# Patient Record
Sex: Female | Born: 1975 | Race: White | Hispanic: No | Marital: Married | Smoking: Never smoker
Health system: Southern US, Community
[De-identification: ages and names within clinical notes are randomized; demographics above are authoritative.]

## PROBLEM LIST (undated history)

## (undated) DIAGNOSIS — R519 Headache, unspecified: Secondary | ICD-10-CM

## (undated) DIAGNOSIS — R51 Headache: Secondary | ICD-10-CM

## (undated) DIAGNOSIS — G709 Myoneural disorder, unspecified: Secondary | ICD-10-CM

## (undated) HISTORY — PX: FOOT SURGERY: SHX648

## (undated) HISTORY — PX: WISDOM TOOTH EXTRACTION: SHX21

---

## 2017-05-28 ENCOUNTER — Other Ambulatory Visit: Payer: Self-pay | Admitting: Specialist

## 2017-05-28 ENCOUNTER — Other Ambulatory Visit: Payer: Self-pay

## 2017-05-28 DIAGNOSIS — Z1231 Encounter for screening mammogram for malignant neoplasm of breast: Secondary | ICD-10-CM

## 2017-06-08 ENCOUNTER — Encounter (INDEPENDENT_AMBULATORY_CARE_PROVIDER_SITE_OTHER): Payer: Self-pay

## 2017-06-08 ENCOUNTER — Ambulatory Visit
Admission: RE | Admit: 2017-06-08 | Discharge: 2017-06-08 | Disposition: A | Discharge status: Home / Self Care | Payer: BLUE CROSS/BLUE SHIELD | Source: Ambulatory Visit

## 2017-06-08 DIAGNOSIS — Z1231 Encounter for screening mammogram for malignant neoplasm of breast: Secondary | ICD-10-CM

## 2017-07-02 ENCOUNTER — Other Ambulatory Visit: Payer: Self-pay | Admitting: Specialist

## 2017-07-02 DIAGNOSIS — R928 Other abnormal and inconclusive findings on diagnostic imaging of breast: Secondary | ICD-10-CM

## 2017-07-05 ENCOUNTER — Ambulatory Visit
Admission: RE | Admit: 2017-07-05 | Discharge: 2017-07-05 | Disposition: A | Discharge status: Home / Self Care | Payer: BLUE CROSS/BLUE SHIELD | Source: Ambulatory Visit | Attending: Specialist | Admitting: Specialist

## 2017-07-05 ENCOUNTER — Ambulatory Visit: Payer: BLUE CROSS/BLUE SHIELD

## 2017-07-05 DIAGNOSIS — R928 Other abnormal and inconclusive findings on diagnostic imaging of breast: Secondary | ICD-10-CM

## 2018-06-10 ENCOUNTER — Other Ambulatory Visit: Payer: Self-pay | Admitting: Specialist

## 2018-06-10 DIAGNOSIS — Z1231 Encounter for screening mammogram for malignant neoplasm of breast: Secondary | ICD-10-CM

## 2018-07-08 ENCOUNTER — Ambulatory Visit
Admission: RE | Admit: 2018-07-08 | Discharge: 2018-07-08 | Disposition: A | Discharge status: Home / Self Care | Payer: BLUE CROSS/BLUE SHIELD | Source: Ambulatory Visit | Attending: Specialist | Admitting: Specialist

## 2018-07-08 DIAGNOSIS — Z1231 Encounter for screening mammogram for malignant neoplasm of breast: Secondary | ICD-10-CM

## 2018-12-16 ENCOUNTER — Other Ambulatory Visit: Payer: Self-pay | Admitting: Orthopedic Surgery

## 2018-12-23 ENCOUNTER — Encounter
Admission: RE | Admit: 2018-12-23 | Discharge: 2018-12-23 | Disposition: A | Discharge status: Home / Self Care | Payer: BLUE CROSS/BLUE SHIELD | Source: Ambulatory Visit | Attending: Orthopedic Surgery | Admitting: Orthopedic Surgery

## 2018-12-23 ENCOUNTER — Other Ambulatory Visit: Payer: Self-pay

## 2018-12-23 DIAGNOSIS — Z01812 Encounter for preprocedural laboratory examination: Secondary | ICD-10-CM | POA: Insufficient documentation

## 2018-12-23 HISTORY — DX: Myoneural disorder, unspecified: G70.9

## 2018-12-23 HISTORY — DX: Headache, unspecified: R51.9

## 2018-12-23 HISTORY — DX: Headache: R51

## 2018-12-23 LAB — CBC WITH DIFFERENTIAL/PLATELET
Abs Immature Granulocytes: 0.01 10*3/uL (ref 0.00–0.07)
BASOS ABS: 0.1 10*3/uL (ref 0.0–0.1)
Basophils Relative: 1 %
Eosinophils Absolute: 0.2 10*3/uL (ref 0.0–0.5)
Eosinophils Relative: 3 %
HEMATOCRIT: 36.4 % (ref 36.0–46.0)
Hemoglobin: 12 g/dL (ref 12.0–15.0)
Immature Granulocytes: 0 %
LYMPHS ABS: 2.4 10*3/uL (ref 0.7–4.0)
Lymphocytes Relative: 38 %
MCH: 29.5 pg (ref 26.0–34.0)
MCHC: 33 g/dL (ref 30.0–36.0)
MCV: 89.4 fL (ref 80.0–100.0)
Monocytes Absolute: 0.3 10*3/uL (ref 0.1–1.0)
Monocytes Relative: 5 %
NRBC: 0 % (ref 0.0–0.2)
Neutro Abs: 3.4 10*3/uL (ref 1.7–7.7)
Neutrophils Relative %: 53 %
Platelets: 244 10*3/uL (ref 150–400)
RBC: 4.07 MIL/uL (ref 3.87–5.11)
RDW: 12 % (ref 11.5–15.5)
WBC: 6.4 10*3/uL (ref 4.0–10.5)

## 2018-12-23 LAB — BASIC METABOLIC PANEL
Anion gap: 7 (ref 5–15)
BUN: 15 mg/dL (ref 6–20)
CO2: 24 mmol/L (ref 22–32)
Calcium: 8.8 mg/dL — ABNORMAL LOW (ref 8.9–10.3)
Chloride: 107 mmol/L (ref 98–111)
Creatinine, Ser: 0.89 mg/dL (ref 0.44–1.00)
GFR calc Af Amer: >60 mL/min (ref >60)
GFR calc non Af Amer: >60 mL/min (ref >60)
Glucose, Bld: 85 mg/dL (ref 70–99)
Potassium: 3.9 mmol/L (ref 3.5–5.1)
Sodium: 138 mmol/L (ref 135–145)

## 2018-12-23 LAB — APTT: aPTT: 29 seconds (ref 24–36)

## 2018-12-23 LAB — PROTIME-INR
INR: 0.98
Prothrombin Time: 12.9 seconds (ref 11.4–15.2)

## 2018-12-23 NOTE — Patient Instructions (Signed)
Your procedure is scheduled on: 12/31/18 Report to Day Surgery.[facility]OND [facility]LOOR To find out your arrival time please call [phone] between 1PM - 3PM on 12/30/18.  Remember: Instructions that are not followed completely may result in serious medical risk,  up to and including death, or upon the discretion of your surgeon and anesthesiologist your  surgery may need to be rescheduled.     _X__ 1. Do not eat food after midnight the night before your procedure.                 No gum chewing or hard candies. You may drink clear liquids up to 2 hours                 before you are scheduled to arrive for your surgery- DO not drink clear                 liquids within 2 hours of the start of your surgery.                 Clear Liquids include:  water, apple juice without pulp, clear carbohydrate                 drink such as Clearfast of Gatorade, Black Coffee or Tea (Do not add                 anything to coffee or tea).  __X__2.  On the morning of surgery brush your teeth with toothpaste and water, you                may rinse your mouth with mouthwash if you wish.  Do not swallow any toothpaste of mouthwash.     _X__ 3.  No Alcohol for 24 hours before or after surgery.   _X__ 4.  Do Not Smoke or use e-cigarettes For 24 Hours Prior to Your Surgery.                 Do not use any chewable tobacco products for at least 6 hours prior to                 surgery.  ____  5.  Bring all medications with you on the day of surgery if instructed.   ____  6.  Notify your doctor if there is any change in your medical condition      (cold, fever, infections).     Do not wear jewelry, make-up, hairpins, clips or nail polish. Do not wear lotions, powders, or perfumes. You may wear deodorant. Do not shave 48 hours prior to surgery. Men may shave face and neck. Do not bring valuables to the hospital.    [facility] any belongings or  valuables.  Contacts, dentures or bridgework may not be worn into surgery. Leave your suitcase in the car. After surgery it may be brought to your room. For patients admitted to the hospital, discharge time is determined by your treatment team.   Patients discharged the day of surgery will not be allowed to drive home.   Please read over the following fact sheets that you were given:   Surgical Site Infection Prevention     __X__ Take these medicines the morning of surgery with A SIP OF WATER:    1. BUPROPION  2.   3.   4.  5.  6.  ____ Fleet Enema (as directed)   _X___ Use CHG Soap as directed  ____ Use inhalers on the day of surgery  ____ Stop metformin 2 days prior to surgery    ____ Take 1/2 of usual insulin dose the night before surgery. No insulin the morning          of surgery.   ____ Stop Coumadin/Plavix/aspirin on   ____ Stop Anti-inflammatories on    ____ Stop supplements until after surgery.    ____ Bring C-Pap to the hospital.

## 2018-12-30 MED ORDER — CEFAZOLIN SODIUM-DEXTROSE 2-4 GM/100ML-% IV SOLN
2.0000 g | INTRAVENOUS | Status: AC
  Administered 2018-12-31: 2 g via INTRAVENOUS

## 2018-12-31 ENCOUNTER — Ambulatory Visit: Payer: BLUE CROSS/BLUE SHIELD | Admitting: Certified Registered"

## 2018-12-31 ENCOUNTER — Encounter: Payer: Self-pay | Admitting: *Deleted

## 2018-12-31 ENCOUNTER — Ambulatory Visit
Admission: RE | Admit: 2018-12-31 | Discharge: 2018-12-31 | Disposition: A | Discharge status: Home / Self Care | Payer: BLUE CROSS/BLUE SHIELD | Attending: Orthopedic Surgery | Admitting: Orthopedic Surgery

## 2018-12-31 ENCOUNTER — Encounter
Admission: RE | Disposition: A | Discharge status: Home / Self Care | Payer: Self-pay | Source: Home / Self Care | Attending: Orthopedic Surgery

## 2018-12-31 DIAGNOSIS — Z79899 Other long term (current) drug therapy: Secondary | ICD-10-CM | POA: Diagnosis not present

## 2018-12-31 DIAGNOSIS — G5601 Carpal tunnel syndrome, right upper limb: Secondary | ICD-10-CM | POA: Diagnosis present

## 2018-12-31 HISTORY — PX: CARPAL TUNNEL RELEASE: SHX101

## 2018-12-31 LAB — POCT PREGNANCY, URINE: Preg Test, Ur: NEGATIVE

## 2018-12-31 SURGERY — CARPAL TUNNEL RELEASE
Anesthesia: General | Site: Hand | Laterality: Right

## 2018-12-31 MED ORDER — ROCURONIUM BROMIDE 50 MG/5ML IV SOLN
INTRAVENOUS | Status: AC
  Filled 2018-12-31: qty 1

## 2018-12-31 MED ORDER — OXYCODONE HCL 5 MG PO TABS: 5.0000 mg | ORAL_TABLET | ORAL | 0 refills | Status: AC | PRN

## 2018-12-31 MED ORDER — FENTANYL CITRATE (PF) 100 MCG/2ML IJ SOLN
25.0000 ug | INTRAMUSCULAR | Status: DC | PRN
  Administered 2018-12-31: 25 ug via INTRAVENOUS

## 2018-12-31 MED ORDER — FENTANYL CITRATE (PF) 100 MCG/2ML IJ SOLN
INTRAMUSCULAR | Status: DC | PRN
  Administered 2018-12-31 (×2): 25 ug via INTRAVENOUS
  Administered 2018-12-31 (×3): 50 ug via INTRAVENOUS

## 2018-12-31 MED ORDER — ONDANSETRON HCL 4 MG/2ML IJ SOLN
INTRAMUSCULAR | Status: AC
  Filled 2018-12-31: qty 2

## 2018-12-31 MED ORDER — FAMOTIDINE 20 MG PO TABS
20.0000 mg | ORAL_TABLET | Freq: Once | ORAL | Status: AC
  Administered 2018-12-31: 20 mg via ORAL

## 2018-12-31 MED ORDER — ONDANSETRON HCL 4 MG/2ML IJ SOLN
INTRAMUSCULAR | Status: DC | PRN
  Administered 2018-12-31: 4 mg via INTRAVENOUS

## 2018-12-31 MED ORDER — FENTANYL CITRATE (PF) 100 MCG/2ML IJ SOLN
INTRAMUSCULAR | Status: AC
  Filled 2018-12-31: qty 2

## 2018-12-31 MED ORDER — DEXAMETHASONE SODIUM PHOSPHATE 10 MG/ML IJ SOLN
INTRAMUSCULAR | Status: AC
  Filled 2018-12-31: qty 1

## 2018-12-31 MED ORDER — ONDANSETRON HCL 4 MG PO TABS: 4.0000 mg | ORAL_TABLET | Freq: Three times a day (TID) | ORAL | 0 refills | Status: AC | PRN

## 2018-12-31 MED ORDER — MIDAZOLAM HCL 2 MG/2ML IJ SOLN
INTRAMUSCULAR | Status: DC | PRN
  Administered 2018-12-31: 2 mg via INTRAVENOUS

## 2018-12-31 MED ORDER — CHLORHEXIDINE GLUCONATE CLOTH 2 % EX PADS
6.0000 | MEDICATED_PAD | Freq: Once | CUTANEOUS | Status: AC
  Administered 2018-12-31: 6 via TOPICAL

## 2018-12-31 MED ORDER — LIDOCAINE HCL (CARDIAC) PF 100 MG/5ML IV SOSY
PREFILLED_SYRINGE | INTRAVENOUS | Status: DC | PRN
  Administered 2018-12-31: 60 mg via INTRAVENOUS

## 2018-12-31 MED ORDER — PROPOFOL 10 MG/ML IV BOLUS
INTRAVENOUS | Status: AC
  Filled 2018-12-31: qty 20

## 2018-12-31 MED ORDER — MIDAZOLAM HCL 2 MG/2ML IJ SOLN
INTRAMUSCULAR | Status: AC
  Filled 2018-12-31: qty 2

## 2018-12-31 MED ORDER — BUPIVACAINE HCL (PF) 0.25 % IJ SOLN
INTRAMUSCULAR | Status: AC
  Filled 2018-12-31: qty 30

## 2018-12-31 MED ORDER — LACTATED RINGERS IV SOLN
INTRAVENOUS | Status: DC
  Administered 2018-12-31: 07:00:00 via INTRAVENOUS

## 2018-12-31 MED ORDER — PROPOFOL 10 MG/ML IV BOLUS
INTRAVENOUS | Status: DC | PRN
  Administered 2018-12-31: 150 mg via INTRAVENOUS
  Administered 2018-12-31: 50 mg via INTRAVENOUS

## 2018-12-31 MED ORDER — LIDOCAINE HCL (PF) 2 % IJ SOLN
INTRAMUSCULAR | Status: AC
  Filled 2018-12-31: qty 10

## 2018-12-31 MED ORDER — FAMOTIDINE 20 MG PO TABS
ORAL_TABLET | ORAL | Status: AC
  Filled 2018-12-31: qty 1

## 2018-12-31 MED ORDER — CHLORHEXIDINE GLUCONATE CLOTH 2 % EX PADS: 6.0000 | MEDICATED_PAD | Freq: Once | CUTANEOUS | Status: DC

## 2018-12-31 MED ORDER — ONDANSETRON HCL 4 MG/2ML IJ SOLN: 4.0000 mg | Freq: Once | INTRAMUSCULAR | Status: DC | PRN

## 2018-12-31 MED ORDER — CEFAZOLIN SODIUM-DEXTROSE 2-4 GM/100ML-% IV SOLN
INTRAVENOUS | Status: AC
  Filled 2018-12-31: qty 100

## 2018-12-31 MED ORDER — SUCCINYLCHOLINE CHLORIDE 20 MG/ML IJ SOLN
INTRAMUSCULAR | Status: AC
  Filled 2018-12-31: qty 1

## 2018-12-31 SURGICAL SUPPLY — 38 items
BANDAGE ELASTIC 4 LF NS (GAUZE/BANDAGES/DRESSINGS) ×2 IMPLANT
BLADE SURG MINI STRL (BLADE) ×2 IMPLANT
BNDG ESMARK 4X12 TAN STRL LF (GAUZE/BANDAGES/DRESSINGS) ×2 IMPLANT
CANISTER SUCT 1200ML W/VALVE (MISCELLANEOUS) ×2 IMPLANT
CHLORAPREP W/TINT 26ML (MISCELLANEOUS) ×2 IMPLANT
CORD BIP STRL DISP 12FT (MISCELLANEOUS) ×2 IMPLANT
CUFF TOURN 18 STER (MISCELLANEOUS) ×2 IMPLANT
DRAPE IMP U-DRAPE 54X76 (DRAPES) ×2 IMPLANT
DRAPE SURG 17X11 SM STRL (DRAPES) ×2 IMPLANT
DRSG GAUZE FLUFF 36X18 (GAUZE/BANDAGES/DRESSINGS) ×2 IMPLANT
ELECT REM PT RETURN 9FT ADLT (ELECTROSURGICAL) ×2
ELECTRODE REM PT RTRN 9FT ADLT (ELECTROSURGICAL) ×1 IMPLANT
FORCEPS JEWEL BIP 4-3/4 STR (INSTRUMENTS) ×2 IMPLANT
GAUZE PETRO XEROFOAM 1X8 (MISCELLANEOUS) ×2 IMPLANT
GAUZE SPONGE 4X4 12PLY STRL (GAUZE/BANDAGES/DRESSINGS) ×2 IMPLANT
GLOVE BIOGEL PI IND STRL 9 (GLOVE) ×4 IMPLANT
GLOVE BIOGEL PI INDICATOR 9 (GLOVE) ×4
GLOVE SURG 9.0 ORTHO LTXF (GLOVE) ×8 IMPLANT
GOWN STRL REUS TWL 2XL XL LVL4 (GOWN DISPOSABLE) ×2 IMPLANT
GOWN STRL REUS W/ TWL LRG LVL3 (GOWN DISPOSABLE) ×3 IMPLANT
GOWN STRL REUS W/TWL LRG LVL3 (GOWN DISPOSABLE) ×3
KIT TURNOVER KIT A (KITS) ×2 IMPLANT
NEEDLE FILTER BLUNT 18X 1/2SAF (NEEDLE)
NEEDLE FILTER BLUNT 18X1 1/2 (NEEDLE) IMPLANT
NS IRRIG 500ML POUR BTL (IV SOLUTION) ×2 IMPLANT
PACK EXTREMITY ARMC (MISCELLANEOUS) ×2 IMPLANT
PAD CAST CTTN 4X4 STRL (SOFTGOODS) ×1 IMPLANT
PADDING CAST 4IN STRL (MISCELLANEOUS) ×1
PADDING CAST BLEND 4X4 STRL (MISCELLANEOUS) ×1 IMPLANT
PADDING CAST COTTON 4X4 STRL (SOFTGOODS) ×1
SLING ARM M TX990204 (SOFTGOODS) IMPLANT
SPLINT CAST 1 STEP 3X12 (MISCELLANEOUS) ×2 IMPLANT
STOCKINETTE STRL 4IN 9604848 (GAUZE/BANDAGES/DRESSINGS) ×2 IMPLANT
STRIP CLOSURE SKIN 1/2X4 (GAUZE/BANDAGES/DRESSINGS) ×2 IMPLANT
SUT ETHILON 4-0 (SUTURE)
SUT ETHILON 4-0 FS2 18XMFL BLK (SUTURE)
SUT ETHILON 5-0 FS-2 18 BLK (SUTURE) ×2 IMPLANT
SUTURE ETHLN 4-0 FS2 18XMF BLK (SUTURE) IMPLANT

## 2018-12-31 NOTE — Discharge Instructions (Signed)
Open Carpal Tunnel Release, Care After °This sheet gives you information about how to care for yourself after your procedure. Your health care provider may also give you more specific instructions. If you have problems or questions, contact your health care provider. °What can I expect after the procedure? °After the procedure, it is common to have: °· Wrist stiffness. °· Bruising. °Follow these instructions at home: °Bathing °· Do not take baths, swim, or use a hot tub until your health care provider approves. Ask your health care provider if you may take showers. °· Keep your bandage (dressing) dry until your health care provider says it can be removed. °If you have a splint or brace: °· Wear the splint or brace as told by your health care provider. You may need to wear it for 2-3 weeks. Remove it only as told by your health care provider. °· Loosen the splint or brace if your fingers tingle, become numb, or turn cold and blue. °· Keep the splint or brace clean. °· If the splint or brace is not waterproof: °? Do not let it get wet. °? Cover it with a watertight covering when you take a bath or a shower. °Incision care ° °· Follow instructions from your health care provider about how to take care of your incision. Make sure you: °? Wash your hands with soap and water before you change your dressing. If soap and water are not available, use hand sanitizer. °? Change your dressing as told by your health care provider. °? Leave stitches (sutures), skin glue, or adhesive strips in place. These skin closures may need to stay in place for 2 weeks or longer. If adhesive strip edges start to loosen and curl up, you may trim the loose edges. Do not remove adhesive strips completely unless your health care provider tells you to do that. °· Check your incision area every day for signs of infection. Check for: °? Redness, swelling, or pain. °? Fluid or blood. °? Warmth. °? Pus or a bad smell. °Managing pain, stiffness, and  swelling ° °· If directed, put ice on the affected area.  °? If you have a removable splint or brace, remove it as told by your health care provider. °? Put ice in a plastic bag. °? Place a towel between your skin and the bag. °? Leave the ice on for 20 minutes, 2-3 times a day. °· Move your fingers often to avoid stiffness and to lessen swelling.  °· Raise (elevate) your wrist above the level of your heart while you are sitting or lying down. °Activity °· Do not drive until your health care provider approves. °· Do not drive or use heavy machinery while taking prescription pain medicine. °· Return to your normal activities as told by your health care provider. Avoid activities that cause pain. °· If physical therapy was prescribed, do exercises as told by your therapist. Physical therapy can help you heal faster and regain movement. °General instructions °· Take over-the-counter and prescription medicines only as told by your health care provider. °· If you are taking prescription pain medicine, take actions to prevent or treat constipation. Your health care provider may recommend that you: °? Drink enough fluid to keep your urine pale yellow. °? Eat foods that are high in fiber, such as fresh fruits and vegetables, whole grains, and beans. °? Limit foods that are high in fat and processed sugars, such as fried or sweet foods. °? Take an over-the-counter or prescription medicine for constipation. °·   Do not use any products that contain nicotine or tobacco, such as cigarettes and e-cigarettes. If you need help quitting, ask your health care provider. °· Keep all follow-up visits as told by your health care provider and physical therapist. This is important. °Contact a health care provider if: °· You have redness or swelling around your incision. °· You have fluid or blood coming from your incision. °· Your incision feels warm to the touch. °· You have pus or a bad smell coming from your incision. °· You have a  fever. °· You have chills. °· You have pain that does not get better with medicine. °· Your carpal tunnel symptoms do not go away after 2 months. °· Your carpal tunnel symptoms go away and then come back. °Get help right away if: °· You have pain or numbness that is getting worse. °· Your fingers or fingertips become very pale or bluish in color. °· You are not able to move your fingers. °Summary °· It is common to have wrist stiffness and bruising after a carpal tunnel release. °· Icing and raising (elevating) your wrist may help to lessen swelling and pain. °· Call your health care provider if you have a fever or notice any signs of infection in your incision area. °This information is not intended to replace advice given to you by your health care provider. Make sure you discuss any questions you have with your health care provider. °Document Released: 05/19/2005 Document Revised: 07/09/2017 Document Reviewed: 07/09/2017 °Elsevier Interactive Patient Education © 2019 [employer] ° ° °AMBULATORY SURGERY  °DISCHARGE INSTRUCTIONS ° ° °1) The drugs that you were given will stay in your system until tomorrow so for the next 24 hours you should not: ° °A) Drive an automobile °B) Make any legal decisions °C) Drink any alcoholic beverage ° ° °2) You may resume regular meals tomorrow.  Today it is better to start with liquids and gradually work up to solid foods. ° °You may eat anything you prefer, but it is better to start with liquids, then soup and crackers, and gradually work up to solid foods. ° ° °3) Please notify your doctor immediately if you have any unusual bleeding, trouble breathing, redness and pain at the surgery site, drainage, fever, or pain not relieved by medication. ° ° ° °4) Additional Instructions: ° ° ° ° ° ° ° °Please contact your physician with any problems or Same Day Surgery at [phone], Monday through Friday 6 am to 4 pm, or [place] [facility] [place] Main number at [phone]. ° °

## 2018-12-31 NOTE — H&P (Signed)
PREOPERATIVE H&P  Chief Complaint: Carpal Tunnel Syndrome, right hand  HPI: Patricia James is a 43 y.o. female who presents for preoperative history and physical with a diagnosis of right Carpal Tunnel Syndrome. Symptoms are rated as moderate to severe, and have been worsening.  This is significantly impairing activities of daily living.  She has elected for surgical management.   Past Medical History:  Diagnosis Date  . Headache   . Neuromuscular disorder (HCC)    CTR   Past Surgical History:  Procedure Laterality Date  . FOOT SURGERY     X 2 LOCAL  . WISDOM TOOTH EXTRACTION     Social History   Socioeconomic History  . Marital status: Married    Spouse name: Not on file  . Number of children: Not on file  . Years of education: Not on file  . Highest education level: Not on file  Occupational History  . Not on file  Social Needs  . Financial resource strain: Not on file  . Food insecurity:    Worry: Not on file    Inability: Not on file  . Transportation needs:    Medical: Not on file    Non-medical: Not on file  Tobacco Use  . Smoking status: Never Smoker  . Smokeless tobacco: Never Used  Substance and Sexual Activity  . Alcohol use: Never    Frequency: Never  . Drug use: Never  . Sexual activity: Not on file  Lifestyle  . Physical activity:    Days per week: Not on file    Minutes per session: Not on file  . Stress: Not on file  Relationships  . Social connections:    Talks on phone: Not on file    Gets together: Not on file    Attends religious service: Not on file    Active member of club or organization: Not on file    Attends meetings of clubs or organizations: Not on file    Relationship status: Not on file  Other Topics Concern  . Not on file  Social History Narrative  . Not on file   Family History  Problem Relation Age of Onset  . Breast cancer Maternal Aunt   . Breast cancer Maternal Grandmother   . Breast cancer Maternal Aunt     No Known Allergies Prior to Admission medications   Medication Sig Start Date End Date Taking? Authorizing Provider  buPROPion (WELLBUTRIN XL) 150 MG 24 hr tablet Take 150 mg by mouth daily. 11/07/18  Yes [provider]  hydroxypropyl methylcellulose / hypromellose (ISOPTO TEARS / GONIOVISC) 2.5 % ophthalmic solution Place 1 drop into both eyes 2 (two) times daily as needed for dry eyes.   Yes [provider]  NORTREL 0.5/35, 28, 0.5-35 MG-MCG tablet Take 1 tablet by mouth daily. 10/02/18  Yes [provider]  propranolol ER (INDERAL LA) 60 MG 24 hr capsule Take 60 mg by mouth at bedtime. 09/20/18  Yes [provider]  SUMAtriptan (IMITREX) 100 MG tablet Take 100 mg by mouth every 2 (two) hours as needed for migraine. 11/03/18  Yes [provider]     Positive ROS: All other systems have been reviewed and were otherwise negative with the exception of those mentioned in the HPI and as above.  Physical Exam: General: Alert, no acute distress Cardiovascular: Regular rate and rhythm, no murmurs rubs or gallops.  No pedal edema Respiratory: Clear to auscultation bilaterally, no wheezes rales or rhonchi. No cyanosis,  no use of accessory musculature GI: No organomegaly, abdomen is soft and non-tender nondistended with positive bowel sounds. Skin: Skin intact, no lesions within the operative field. Neurologic: Sensation intact distally Psychiatric: Patient is competent for consent with normal mood and affect Lymphatic: No cervical lymphadenopathy  MUSCULOSKELETAL: right hand:  + Tinel's, full digital ROM, intact to sensation to light touch, + paresthesias, no thenar atrophy  Assessment: Right Carpal Tunnel Syndrome  Plan: Plan for Procedure(s): RIGHT CARPAL TUNNEL RELEASE  I have discussed the details of the operation and the post-operative course with the patient and her husband who was at the bedside.  A pre-op H&P was performed this AM.  I  signed the right hand according to the hospital's correct site of surgery protocol.    I discussed the risks and benefits of surgery. The risks include but are not limited to infection, bleeding, nerve or blood vessel injury, joint stiffness or loss of motion, persistent pain, weakness or instability, recurrent carpal tunnel symptoms and the need for further surgery. Medical risks include but are not limited to DVT and pulmonary embolism, myocardial infarction, stroke, pneumonia, respiratory failure and death. Patient understood these risks and wished to proceed.     Juanell Fairly, MD   12/31/2018 8:02 AM

## 2018-12-31 NOTE — Anesthesia Post-op Follow-up Note (Signed)
Anesthesia QCDR form completed.        

## 2018-12-31 NOTE — Anesthesia Procedure Notes (Signed)
Procedure Name: LMA Insertion Date/Time: 12/31/2018 8:22 AM Performed by: Danelle Berry, CRNA Pre-anesthesia Checklist: Patient identified, Emergency Drugs available, Suction available, Patient being monitored and Timeout performed Patient Re-evaluated:Patient Re-evaluated prior to induction Oxygen Delivery Method: Circle system utilized and Simple face mask Preoxygenation: Pre-oxygenation with 100% oxygen Induction Type: IV induction Ventilation: Mask ventilation without difficulty LMA Size: 4.0 Number of attempts: 1 Placement Confirmation: positive ETCO2 Dental Injury: Teeth and Oropharynx as per pre-operative assessment

## 2018-12-31 NOTE — Op Note (Signed)
12/31/2018  9:32 AM  PATIENT:  Patricia James    PRE-OPERATIVE DIAGNOSIS:  Right Carpal Tunnel Syndrome  POST-OPERATIVE DIAGNOSIS:  Same  PROCEDURE:  RIGHT OPEN CARPAL TUNNEL RELEASE  SURGEON:  Thornton Park, MD  ANESTHESIA:   General  PREOPERATIVE INDICATIONS:  Patricia James is a  43 y.o. female with a diagnosis of Right Carpal Tunnel Syndrome who failed conservative measures and elected for surgical management.    I discussed the risks and benefits of surgery. The risks include but are not limited to infection, bleeding, nerve or blood vessel injury, joint stiffness or loss of motion, persistent pain, weakness, recurrence of symptoms and the need for further surgery. Medical risks include but are not limited to DVT and pulmonary embolism, myocardial infarction, stroke, pneumonia, respiratory failure and death. Patient understood these risks and wished to proceed.   OPERATIVE FINDINGS: Significant median nerve compression at the carpal tunnel, right upper extremity  OPERATIVE PROCEDURE: Patient was met in the preoperative area with her husband at the bedside. I signed the right wrist with my initials and the word yes according the hospital's correct site of surgery protocol. The pre-op H&P was performed at the bedside this AM. I answered all the patient's questions. She was then brought to the operating room where she underwent general with LMA.  The patient was positioned supine on the operative table. The right arm was placed on a hand table. A tourniquet was applied to the right upper extremity. She was prepped and draped in a sterile fashion. A timeout was performed to verify the patient's name, date of birth, medical record number, correct site of surgery correct procedure to be performed. The time out was also used to confirm the patient received antibiotics that all necessary instruments were available in the room. The right upper extremity was then  exsanguinated with an Esmarch and the tourniquet inflated to 250 mmHg.   An incision following the palmar crease was made. This was made in line with the web space between the middle and ring fingers and the distal extent of the incision was where it intersected Kaplan's cardinal line. Bleeding vessels were cauterized with a bipolar.  The subcutaneous tissue was carefully dissected out with a Metzenbaum scissor and pickup until the palmar fascia was encountered. The distal extent of the transverse carpal ligament was then identified. A Freer elevator was placed under the transverse carpal ligament running distally to proximally. A micro-Beaver blade was then used to incise the transverse carpal ligament taking care to avoid injury to any neurovascular structures. The carpal tunnel was found to be extremely constricted. There was significant compression on the median nerve. The transverse carpal ligament was completely released. The nerve was visualized in its entirety and the carpal tunnel. The wound was copiously irrigated. The skin was then approximated with 5-0 nylon. Xeroform was placed over the incision. A dry sterile dressing was applied along with a volar fiberglass splint. Patient was overwrapped with an Ace wrap. The tourniquet was deflated at 33 minutes.  Sling was placed on the right upper extremity. The patient was extubated and brought to the PACU in stable condition. I was scrubbed and present the entire case and all sharp and instrument counts were correct at the conclusion the case.  I spoke with the patient's husband in the postop consultation room to let him know the patient was stable in the recovery room the case had been performed without complication.     Timoteo Gaul, MD

## 2018-12-31 NOTE — Anesthesia Preprocedure Evaluation (Signed)
Anesthesia Evaluation  Patient identified by MRN, date of birth, ID band Patient awake    Reviewed: Allergy & Precautions, NPO status , Patient's Chart, lab work & pertinent test results  Airway Mallampati: II  TM Distance: >3 FB     Dental  (+) Teeth Intact   Pulmonary    Pulmonary exam normal        Cardiovascular negative cardio ROS Normal cardiovascular exam     Neuro/Psych  Headaches, negative psych ROS   GI/Hepatic negative GI ROS, Neg liver ROS,   Endo/Other  negative endocrine ROS  Renal/GU negative Renal ROS  negative genitourinary   Musculoskeletal negative musculoskeletal ROS (+)   Abdominal Normal abdominal exam  (+)   Peds negative pediatric ROS (+)  Hematology negative hematology ROS (+)   Anesthesia Other Findings Past Medical History: No date: Headache No date: Neuromuscular disorder (HCC)     Comment:  CTR  Reproductive/Obstetrics                             Anesthesia Physical Anesthesia Plan  ASA: II  Anesthesia Plan: General   Post-op Pain Management:    Induction: Intravenous  PONV Risk Score and Plan:   Airway Management Planned: LMA  Additional Equipment:   Intra-op Plan:   Post-operative Plan: Extubation in OR  Informed Consent: I have reviewed the patients History and Physical, chart, labs and discussed the procedure including the risks, benefits and alternatives for the proposed anesthesia with the patient or authorized representative who has indicated his/her understanding and acceptance.     Dental advisory given  Plan Discussed with: CRNA and Surgeon  Anesthesia Plan Comments:         Anesthesia Quick Evaluation

## 2018-12-31 NOTE — Transfer of Care (Signed)
Immediate Anesthesia Transfer of Care Note  Patient: Makeisha Miu  Procedure(s) Performed: CARPAL TUNNEL RELEASE (Right Hand)  Patient Location: PACU  Anesthesia Type:General  Level of Consciousness: awake, alert  and oriented  Airway & Oxygen Therapy: Patient Spontanous Breathing and Patient connected to face mask oxygen  Post-op Assessment: Report given to RN and Post -op Vital signs reviewed and stable  Post vital signs: Reviewed and stable  Last Vitals:  Vitals Value Taken Time  BP    Temp    Pulse 85 12/31/2018  9:23 AM  Resp 14 12/31/2018  9:23 AM  SpO2 100 % 12/31/2018  9:23 AM  Vitals shown include unvalidated device data.  Last Pain:  Vitals:   12/31/18 0629  TempSrc: Tympanic  PainSc: 0-No pain         Complications: No apparent anesthesia complications

## 2019-01-07 NOTE — Anesthesia Postprocedure Evaluation (Signed)
Anesthesia Post Note  Patient: Patricia James  Procedure(s) Performed: CARPAL TUNNEL RELEASE (Right Hand)  Patient location during evaluation: PACU Anesthesia Type: General Level of consciousness: awake and alert and oriented Pain management: pain level controlled Vital Signs Assessment: post-procedure vital signs reviewed and stable Respiratory status: spontaneous breathing Cardiovascular status: blood pressure returned to baseline Anesthetic complications: no     Last Vitals:  Vitals:   12/31/18 1023 12/31/18 1322  BP: (!) 136/59 135/69  Pulse: 66 69  Resp: 16 16  Temp: 36.4 C   SpO2: 98% 100%    Last Pain:  Vitals:   12/31/18 1322  TempSrc:   PainSc: 0-No pain                 Jaeger Trueheart

## 2019-07-01 ENCOUNTER — Other Ambulatory Visit: Payer: Self-pay | Admitting: Specialist

## 2019-07-01 DIAGNOSIS — Z1231 Encounter for screening mammogram for malignant neoplasm of breast: Secondary | ICD-10-CM

## 2019-08-13 ENCOUNTER — Other Ambulatory Visit: Payer: Self-pay

## 2019-08-13 ENCOUNTER — Ambulatory Visit
Admission: RE | Admit: 2019-08-13 | Discharge: 2019-08-13 | Disposition: A | Discharge status: Home / Self Care | Payer: BC Managed Care – PPO | Source: Ambulatory Visit | Attending: Specialist | Admitting: Specialist

## 2019-08-13 DIAGNOSIS — Z1231 Encounter for screening mammogram for malignant neoplasm of breast: Secondary | ICD-10-CM

## 2020-07-22 ENCOUNTER — Other Ambulatory Visit: Payer: Self-pay | Admitting: Specialist

## 2020-07-22 DIAGNOSIS — Z1231 Encounter for screening mammogram for malignant neoplasm of breast: Secondary | ICD-10-CM

## 2020-08-18 ENCOUNTER — Ambulatory Visit
Admission: RE | Admit: 2020-08-18 | Discharge: 2020-08-18 | Disposition: A | Discharge status: Home / Self Care | Payer: BC Managed Care – PPO | Source: Ambulatory Visit | Attending: Specialist | Admitting: Specialist

## 2020-08-18 ENCOUNTER — Other Ambulatory Visit: Payer: Self-pay

## 2020-08-18 DIAGNOSIS — Z1231 Encounter for screening mammogram for malignant neoplasm of breast: Secondary | ICD-10-CM

## 2021-09-09 ENCOUNTER — Other Ambulatory Visit: Payer: Self-pay | Admitting: Internal Medicine

## 2021-09-09 DIAGNOSIS — Z1231 Encounter for screening mammogram for malignant neoplasm of breast: Secondary | ICD-10-CM

## 2021-10-13 ENCOUNTER — Ambulatory Visit
Admission: RE | Admit: 2021-10-13 | Discharge: 2021-10-13 | Disposition: A | Discharge status: Home / Self Care | Payer: BC Managed Care – PPO | Source: Ambulatory Visit | Attending: Internal Medicine | Admitting: Internal Medicine

## 2021-10-13 DIAGNOSIS — Z1231 Encounter for screening mammogram for malignant neoplasm of breast: Secondary | ICD-10-CM

## 2022-05-19 IMAGING — MG MM DIGITAL SCREENING BILAT W/ TOMO AND CAD
8 series · 8 of 24 positions shown · non-contrast
Comparison: Previous exam(s).

CLINICAL DATA: Screening.

EXAM:
DIGITAL SCREENING BILATERAL MAMMOGRAM WITH TOMOSYNTHESIS AND CAD
TECHNIQUE: Bilateral screening digital craniocaudal and mediolateral oblique
mammograms were obtained. Bilateral screening digital breast
tomosynthesis was performed. The images were evaluated with
computer-aided detection.

[L MLO synth-2D]
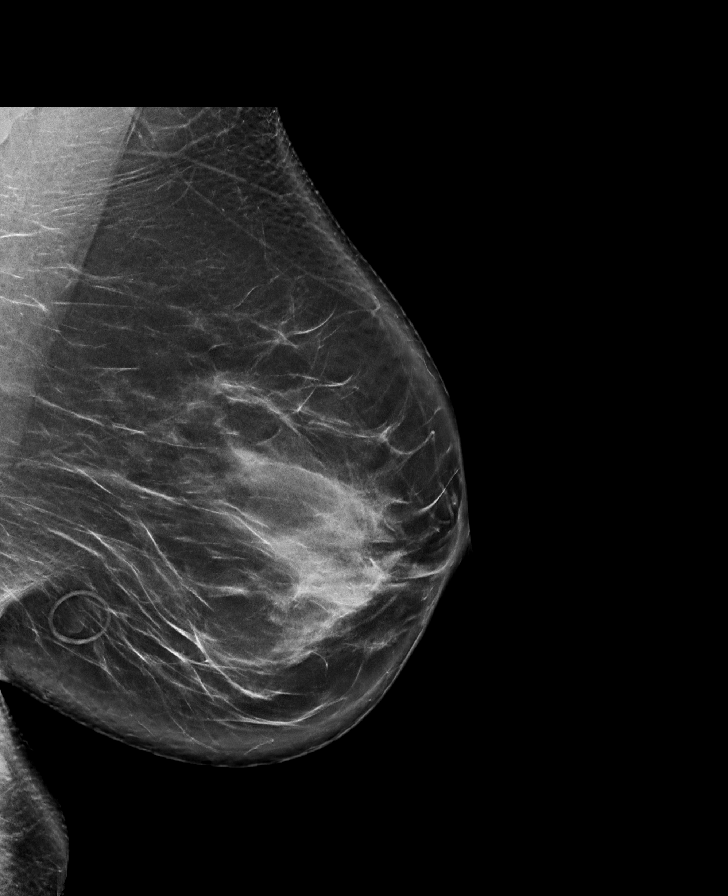

[R CC synth-2D]
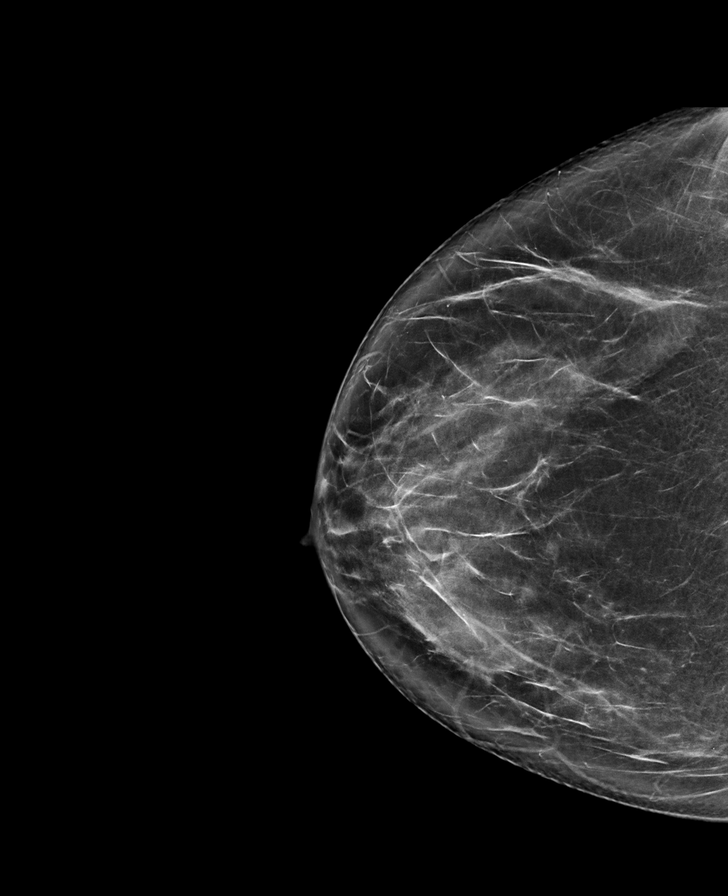

[L CC synth-2D]
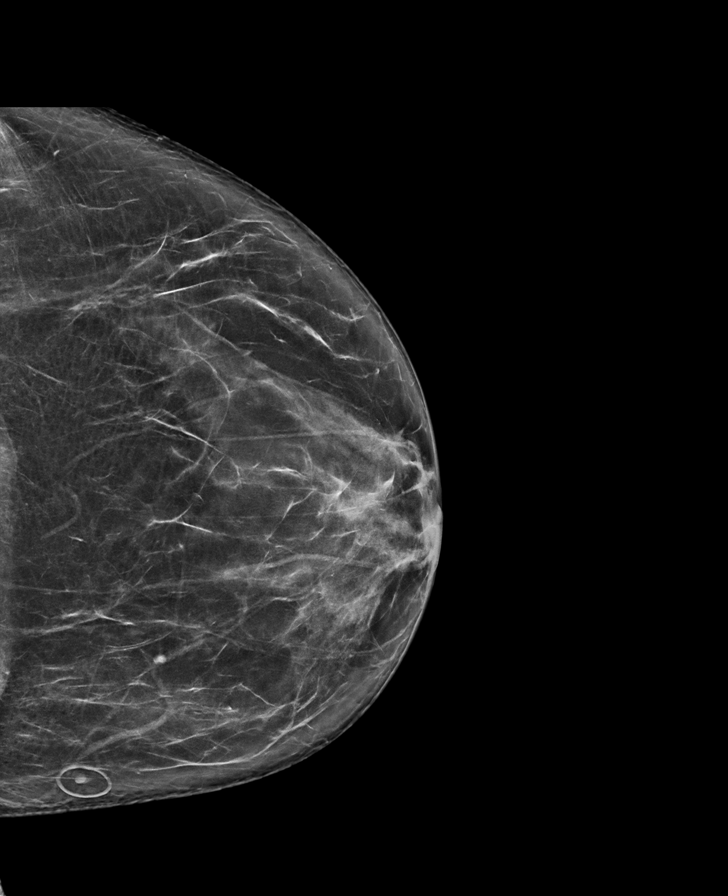

[R MLO synth-2D]
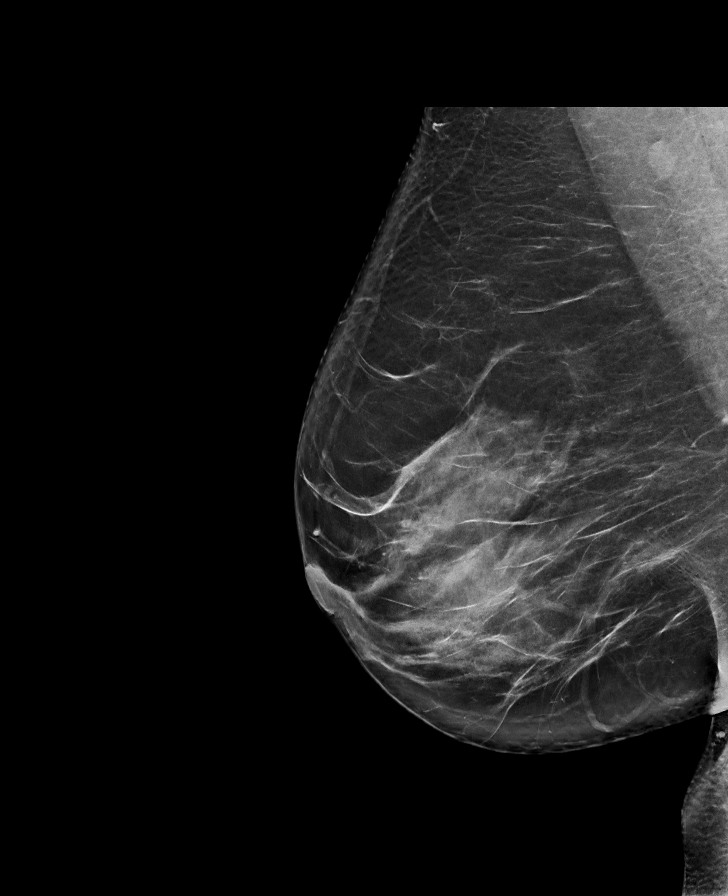

[L CC tomo · tomo slice 43/84.0]
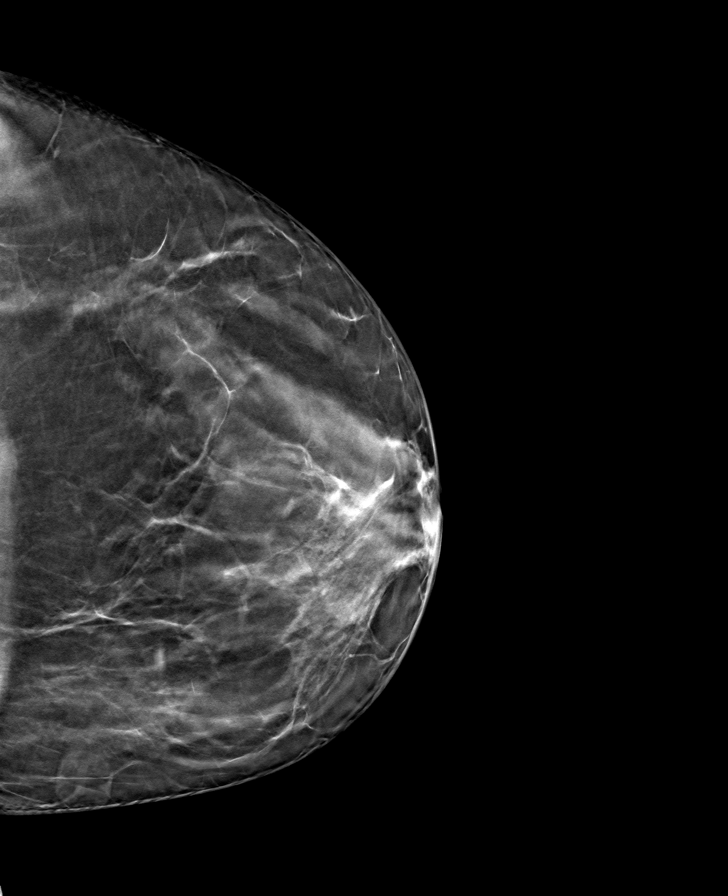

[L MLO tomo · tomo slice 49/98.0]
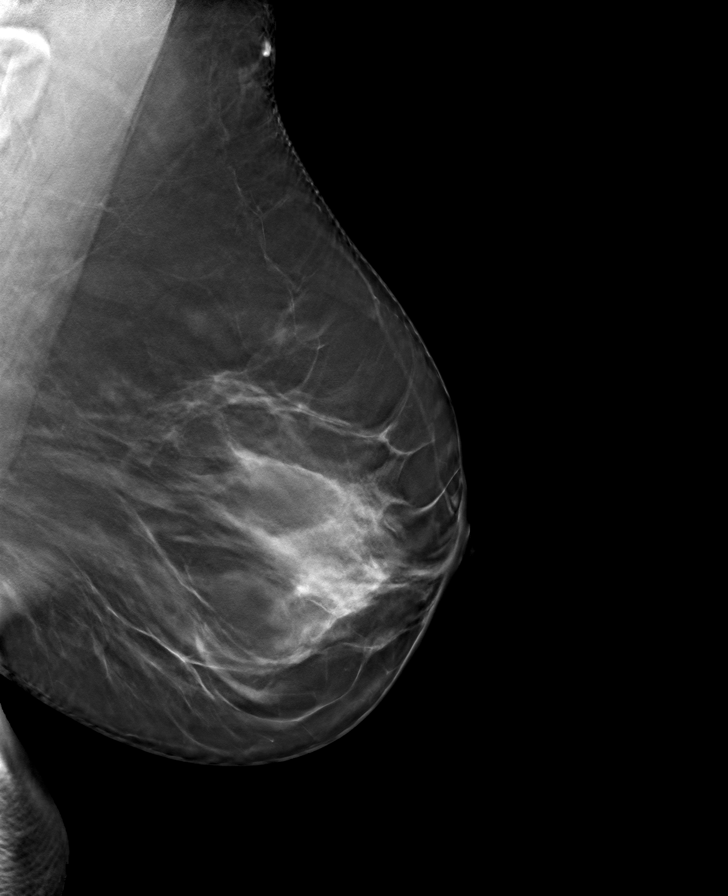

[R MLO tomo · tomo slice 49/96.0]
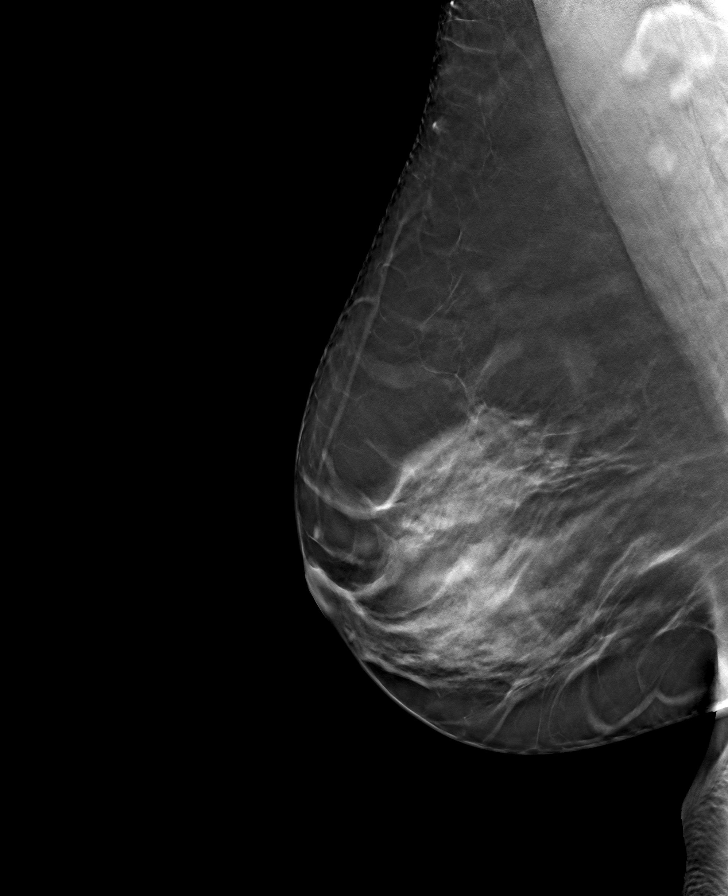

[R CC tomo · tomo slice 43/85.0]
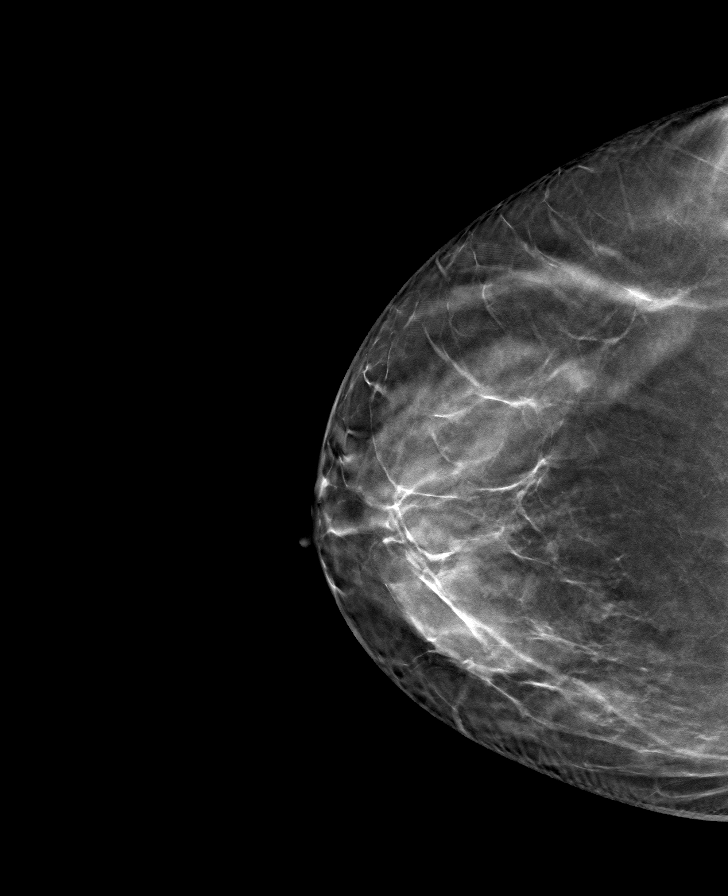

[8 of 24 positions shown; findings below may reference images not displayed]

ACR Breast Density Category c: The breast tissue is heterogeneously
dense, which may obscure small masses.
FINDINGS: There are no findings suspicious for malignancy.
IMPRESSION: No mammographic evidence of malignancy. A result letter of this
screening mammogram will be mailed directly to the patient.

RECOMMENDATION:
Screening mammogram in one year. (Code:Q3-W-BC3)

BI-RADS CATEGORY  1: Negative.

## 2022-09-04 ENCOUNTER — Other Ambulatory Visit: Payer: Self-pay | Admitting: Internal Medicine

## 2022-09-04 DIAGNOSIS — Z1231 Encounter for screening mammogram for malignant neoplasm of breast: Secondary | ICD-10-CM

## 2022-10-24 ENCOUNTER — Ambulatory Visit
Admission: RE | Admit: 2022-10-24 | Discharge: 2022-10-24 | Disposition: A | Discharge status: Home / Self Care | Payer: BC Managed Care – PPO | Source: Ambulatory Visit | Attending: Internal Medicine | Admitting: Internal Medicine

## 2022-10-24 DIAGNOSIS — Z1231 Encounter for screening mammogram for malignant neoplasm of breast: Secondary | ICD-10-CM

## 2023-09-21 ENCOUNTER — Other Ambulatory Visit: Payer: Self-pay | Admitting: Internal Medicine

## 2023-09-21 DIAGNOSIS — Z1231 Encounter for screening mammogram for malignant neoplasm of breast: Secondary | ICD-10-CM

## 2023-10-29 ENCOUNTER — Ambulatory Visit
Admission: RE | Admit: 2023-10-29 | Discharge: 2023-10-29 | Disposition: A | Discharge status: Home / Self Care | Payer: BC Managed Care – PPO | Source: Ambulatory Visit | Attending: Internal Medicine | Admitting: Internal Medicine

## 2023-10-29 DIAGNOSIS — Z1231 Encounter for screening mammogram for malignant neoplasm of breast: Secondary | ICD-10-CM

## 2024-09-15 ENCOUNTER — Other Ambulatory Visit: Payer: Self-pay | Admitting: Internal Medicine

## 2024-09-15 DIAGNOSIS — Z1231 Encounter for screening mammogram for malignant neoplasm of breast: Secondary | ICD-10-CM

## 2024-10-29 ENCOUNTER — Inpatient Hospital Stay: Admission: RE | Admit: 2024-10-29 | Discharge: 2024-10-29 | Attending: Internal Medicine | Admitting: Internal Medicine

## 2024-10-29 DIAGNOSIS — Z1231 Encounter for screening mammogram for malignant neoplasm of breast: Secondary | ICD-10-CM
